# Patient Record
Sex: Female | Born: 2000 | Race: White | Hispanic: Yes | Marital: Single | Smoking: Never smoker
Health system: Southern US, Community
[De-identification: ages and names within clinical notes are randomized; demographics above are authoritative.]

## PROBLEM LIST (undated history)

## (undated) ENCOUNTER — Inpatient Hospital Stay (HOSPITAL_COMMUNITY): Payer: Self-pay

---
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
## 2022-01-12 ENCOUNTER — Emergency Department (HOSPITAL_COMMUNITY)
Admission: EM | Admit: 2022-01-12 | Discharge: 2022-01-12 | Discharge status: Against Medical Advice | Payer: Medicaid Other | Attending: Emergency Medicine | Admitting: Emergency Medicine

## 2022-01-12 ENCOUNTER — Encounter (HOSPITAL_BASED_OUTPATIENT_CLINIC_OR_DEPARTMENT_OTHER): Payer: Self-pay | Admitting: Emergency Medicine

## 2022-01-12 ENCOUNTER — Emergency Department (HOSPITAL_BASED_OUTPATIENT_CLINIC_OR_DEPARTMENT_OTHER): Payer: Medicaid Other

## 2022-01-12 ENCOUNTER — Emergency Department (HOSPITAL_BASED_OUTPATIENT_CLINIC_OR_DEPARTMENT_OTHER)
Admission: EM | Admit: 2022-01-12 | Discharge: 2022-01-12 | Disposition: A | Discharge status: Home / Self Care | Payer: Medicaid Other | Source: Home / Self Care | Attending: Emergency Medicine | Admitting: Emergency Medicine

## 2022-01-12 ENCOUNTER — Encounter (HOSPITAL_COMMUNITY): Payer: Self-pay

## 2022-01-12 ENCOUNTER — Other Ambulatory Visit: Payer: Self-pay

## 2022-01-12 DIAGNOSIS — R29810 Facial weakness: Secondary | ICD-10-CM | POA: Insufficient documentation

## 2022-01-12 DIAGNOSIS — R202 Paresthesia of skin: Secondary | ICD-10-CM | POA: Insufficient documentation

## 2022-01-12 DIAGNOSIS — R519 Headache, unspecified: Secondary | ICD-10-CM | POA: Insufficient documentation

## 2022-01-12 DIAGNOSIS — Z5321 Procedure and treatment not carried out due to patient leaving prior to being seen by health care provider: Secondary | ICD-10-CM | POA: Insufficient documentation

## 2022-01-12 DIAGNOSIS — H538 Other visual disturbances: Secondary | ICD-10-CM | POA: Insufficient documentation

## 2022-01-12 DIAGNOSIS — R200 Anesthesia of skin: Secondary | ICD-10-CM | POA: Insufficient documentation

## 2022-01-12 LAB — CBC WITH DIFFERENTIAL/PLATELET
Abs Immature Granulocytes: 0.02 10*3/uL (ref 0.00–0.07)
Basophils Absolute: 0 10*3/uL (ref 0.0–0.1)
Basophils Relative: 1 %
Eosinophils Absolute: 0.1 10*3/uL (ref 0.0–0.5)
Eosinophils Relative: 1 %
HCT: 39.1 % (ref 36.0–46.0)
Hemoglobin: 13.4 g/dL (ref 12.0–15.0)
Immature Granulocytes: 0 %
Lymphocytes Relative: 37 %
Lymphs Abs: 2.5 10*3/uL (ref 0.7–4.0)
MCH: 31.5 pg (ref 26.0–34.0)
MCHC: 34.3 g/dL (ref 30.0–36.0)
MCV: 91.8 fL (ref 80.0–100.0)
Monocytes Absolute: 0.5 10*3/uL (ref 0.1–1.0)
Monocytes Relative: 8 %
Neutro Abs: 3.7 10*3/uL (ref 1.7–7.7)
Neutrophils Relative %: 53 %
Platelets: 280 10*3/uL (ref 150–400)
RBC: 4.26 MIL/uL (ref 3.87–5.11)
RDW: 12.8 % (ref 11.5–15.5)
WBC: 6.8 10*3/uL (ref 4.0–10.5)
nRBC: 0 % (ref 0.0–0.2)

## 2022-01-12 LAB — BASIC METABOLIC PANEL
Anion gap: 8 (ref 5–15)
BUN: 8 mg/dL (ref 6–20)
CO2: 24 mmol/L (ref 22–32)
Calcium: 9.8 mg/dL (ref 8.9–10.3)
Chloride: 105 mmol/L (ref 98–111)
Creatinine, Ser: 0.58 mg/dL (ref 0.44–1.00)
GFR, Estimated: >60 mL/min (ref >60)
Glucose, Bld: 94 mg/dL (ref 70–99)
Potassium: 4.6 mmol/L (ref 3.5–5.1)
Sodium: 137 mmol/L (ref 135–145)

## 2022-01-12 MED ORDER — PREDNISONE 50 MG PO TABS
60.0000 mg | ORAL_TABLET | Freq: Once | ORAL | Status: AC
  Administered 2022-01-12: 60 mg via ORAL
  Filled 2022-01-12: qty 1

## 2022-01-12 MED ORDER — PREDNISONE 10 MG PO TABS: 50.0000 mg | ORAL_TABLET | Freq: Every day | ORAL | 0 refills | Status: AC

## 2022-01-12 NOTE — ED Triage Notes (Signed)
C/o left sided facial numbness and left sided facial (mouth) droop since last Wednesday. Also endorses changes in BL vision (blurry) and right temporal tenderness since Friday. Denies headaches, n/v, dizziness, head trauma.

## 2022-01-12 NOTE — ED Provider Triage Note (Signed)
Emergency Medicine Provider Triage Evaluation Note  Carrie Downs , a 21 y.o. female  was evaluated in triage.  Pt complains of left-sided jaw numbness since 01/07/2022.  She noticed this in the evening when she was brushing her teeth.  This has persisted since then.  She denies headache or vision changes.  Reports some soreness to the right side of her face but she is unsure if this is related.  Denies any injuries or trauma, recent dental pain or procedure.  Review of Systems  Positive: Left-sided jaw numbness, right-sided facial pain Negative: Headache, dental pain  Physical Exam  BP 115/79 (BP Location: Right Arm)   Pulse 72   Temp 98.2 F (36.8 C) (Oral)   Resp 17   Ht 5\' 3"  (1.6 m)   Wt 67.6 kg   SpO2 100%   BMI 26.39 kg/m  Gen:   Awake, no distress   Resp:  Normal effort  MSK:   Moves extremities without difficulty  Other:  Patient reports decreased sensation to light touch of left jaw area.  No facial asymmetry.  Normal speech.  Strength 5/5 in bilateral upper and lower extremities  Medical Decision Making  Medically screening exam initiated at 2:39 PM.  Appropriate orders placed.  Kendell Gammon was informed that the remainder of the evaluation will be completed by another provider, this initial triage assessment does not replace that evaluation, and the importance of remaining in the ED until their evaluation is complete.  No indication for code stroke   Brantley Fling, PA-C 01/12/22 1440

## 2022-01-12 NOTE — ED Notes (Signed)
Pt states that she is leaving due to the wait time and is going to another hospital

## 2022-01-12 NOTE — Discharge Instructions (Addendum)
You were diagnosed with a possible Bell's palsy today, which is a paralysis of the facial nerve.  I included information for you to read about.  Your next dose of steroids is due tomorrow morning.  I also placed a referral to neurology.  They should contact you within 3 business days to set up a follow-up appointment.  In the meantime, you can schedule a follow-up appointment with your pediatrician towards the end of the week as you complete your steroids.  We talked about return precautions to the emergency room.  This would include sudden, severe worsening headache, loss of vision, painful vision (pain in eye, sensitivity to light), or numbness or weakness of the arms or legs.

## 2022-01-12 NOTE — ED Provider Notes (Signed)
[facility] EMERGENCY DEPARTMENT Provider Note   CSN: [id number] Arrival date & time: 01/12/22  1649     History {Add pertinent medical, surgical, social history, OB history to HPI:1} Chief Complaint  Patient presents with   Facial Droop    Carrie Downs is a 21 y.o. female presenting to ED with complaint of some paresthesias of the left side of the face, ongoing for approximately 5 days.  She has never had the symptoms before.  She reports it began with a tingling sensation down the left half of her face, she thought a left lower sided facial droop, and she began having some blurred vision, not clear which eye was more blurry, but that has been intermittent.  She reports that she has a mild frontal headache as well, also located along the right temple region.  She denies nausea, vomiting.  She denies any recent tick bites or new rashes.  She denies any new medications.  She denies history of Bell's palsy.  Her mother is present at bedside to provide supplemental history, denies any family history of MS or autoimmune disease.  Patient denies any neck pain, fevers, tingling of the hands and feet.  She went to [facility] emergency department originally, where she was triaged with a left due to a long wait.  She did have a screening EKG performed at that time.  She has not had any neuroimaging done.  HPI     Home Medications Prior to Admission medications   Medication Sig Start Date End Date Taking? Authorizing Provider  ibuprofen (ADVIL,MOTRIN) 600 MG tablet Take 1 tablet (600 mg total) by mouth every 6 (six) hours as needed. 09/23/17   Jacalyn Lefevre, MD      Allergies    Patient has no known allergies.    Review of Systems   Review of Systems  Physical Exam Updated Vital Signs BP 108/69 (BP Location: Right Arm)   Pulse 76   Temp 98.9 F (37.2 C) (Oral)   Resp 16   Ht 5\' 3"  (1.6 m)   Wt 67.6 kg   SpO2 99%   BMI 26.39 kg/m  Physical Exam Constitutional:       General: She is not in acute distress. HENT:     Head: Normocephalic and atraumatic.     Right Ear: Tympanic membrane and ear canal normal.     Left Ear: Tympanic membrane and ear canal normal.  Eyes:     Conjunctiva/sclera: Conjunctivae normal.     Pupils: Pupils are equal, round, and reactive to light.  Cardiovascular:     Rate and Rhythm: Normal rate and regular rhythm.  Pulmonary:     Effort: Pulmonary effort is normal. No respiratory distress.  Abdominal:     General: There is no distension.     Tenderness: There is no abdominal tenderness.  Skin:    General: Skin is warm and dry.  Neurological:     General: No focal deficit present.     Mental Status: She is alert. Mental status is at baseline.     Sensory: No sensory deficit.     Motor: No weakness.     Comments: Paresthesias of the left half of the face Flattening of the forehead muscle No temporal artery tenderness  Psychiatric:        Mood and Affect: Mood normal.        Behavior: Behavior normal.     ED Results / Procedures / Treatments   Labs (all labs  ordered are listed, but only abnormal results are displayed) Labs Reviewed - No data to display  EKG None  Radiology No results found.  Procedures Procedures  {Document cardiac monitor, telemetry assessment procedure when appropriate:1}  Medications Ordered in ED Medications - No data to display  ED Course/ Medical Decision Making/ A&P                           Medical Decision Making Amount and/or Complexity of Data Reviewed Radiology: ordered.   Patient is presenting with an unusual constellation of symptoms, that could be consistent with a Bell's palsy, specifically left-sided paresthesias.  She complains of some right-sided temporal pain as well, and the differential diagnosis could include complex migraine versus sinusitis versus other intracranial lesion.  Because she has never had neuroimaging I think a CT scan of the head is reasonable.  CT  was ordered and personally reviewed, showing ***  Her headache is extremely minimal, not requiring IV medications.  Low suspicion for cerebral venous thrombosis.  Her vision appears grossly intact, including the peripheral fields, there is no double or blurred vision at this time.  I have a lower suspicion for new onset multiple sclerosis as well.  However if she continues having worsening symptoms including blurred or loss of vision, or numbness or weakness of the arms or legs, I recommended she return to the ER for more emergent MRI imaging at that time.  In the meantime I will refer to neurology.  We will start the patient on prednisone for suspected Bell's palsy and referred to neurology  {Document critical care time when appropriate:1} {Document review of labs and clinical decision tools ie heart score, Chads2Vasc2 etc:1}  {Document your independent review of radiology images, and any outside records:1} {Document your discussion with family members, caretakers, and with consultants:1} {Document social determinants of health affecting pt's care:1} {Document your decision making why or why not admission, treatments were needed:1} Final Clinical Impression(s) / ED Diagnoses Final diagnoses:  None    Rx / DC Orders ED Discharge Orders     None

## 2022-01-12 NOTE — ED Triage Notes (Signed)
Reports left sided facial numbness and right side facial soreness.  REports feels as though her taste buds have been burnt off.

## 2022-01-22 ENCOUNTER — Encounter: Payer: Self-pay | Admitting: Neurology

## 2022-01-22 ENCOUNTER — Ambulatory Visit: Payer: Medicaid Other | Admitting: Neurology

## 2022-01-22 VITALS — BP 102/70 | HR 83 | Ht 63.6 in | Wt 174.5 lb

## 2022-01-22 DIAGNOSIS — G510 Bell's palsy: Secondary | ICD-10-CM

## 2022-01-22 NOTE — Progress Notes (Signed)
[facility]  PATIENT: Carrie Downs DOB: 12-May-2001  REQUESTING CLINICIAN: Terald Sleeper, MD HISTORY FROM: Patient and mother   REASON FOR VISIT: Right facial weakness and numbness   HISTORICAL  CHIEF COMPLAINT:  Chief Complaint  Patient presents with   New Patient (Initial Visit)    Rm 15. Accompanied by mom and sister. NP internal referral for Facial paresthesia.    HISTORY OF PRESENT ILLNESS:  This is a 21 year old womanoman with no reported past medical history who is presenting for follow up after being seen in the ED on 6/19 for right facial weakness. Patient reports her initial symptoms were right facial soreness and feel like her taste buds have been burned off and feel like her vision was blurred. She presented 5 days after the onset of symptoms. In the ED, she had a head CT which was negative, exam consistent with right facial paralysis and she was discharged with steroid. She denies any weakness or numbness in the extremities.  She completed the steroid treatment and reports improvement of her symptoms. Her main symptoms currently is right watery eye.    OTHER MEDICAL CONDITIONS: None    REVIEW OF SYSTEMS: Full 14 system review of systems performed and negative with exception of: as noted in the HPI   ALLERGIES: No Known Allergies  HOME MEDICATIONS: Outpatient Medications Prior to Visit  Medication Sig Dispense Refill   ibuprofen (ADVIL,MOTRIN) 600 MG tablet Take 1 tablet (600 mg total) by mouth every 6 (six) hours as needed. 30 tablet 0   No facility-administered medications prior to visit.    PAST MEDICAL HISTORY: History reviewed. No pertinent past medical history.  PAST SURGICAL HISTORY: History reviewed. No pertinent surgical history.  FAMILY HISTORY: Family History  Problem Relation Age of Onset   Diabetes Brother     SOCIAL HISTORY: Social History   Socioeconomic History   Marital status: Single    Spouse name: Not on  file   Number of children: Not on file   Years of education: Not on file   Highest education level: Not on file  Occupational History   Not on file  Tobacco Use   Smoking status: Never   Smokeless tobacco: Never  Vaping Use   Vaping Use: Never used  Substance and Sexual Activity   Alcohol use: Never   Drug use: Never   Sexual activity: Not on file  Other Topics Concern   Not on file  Social History Narrative   Not on file   Social Determinants of Health   Financial Resource Strain: Not on file  Food Insecurity: Not on file  Transportation Needs: Not on file  Physical Activity: Not on file  Stress: Not on file  Social Connections: Not on file  Intimate Partner Violence: Not on file    PHYSICAL EXAM   GENERAL EXAM/CONSTITUTIONAL: Vitals:  Vitals:   01/22/22 1019  BP: 102/70  Pulse: 83  Weight: 174 lb 8 oz (79.2 kg)  Height: 5' 3.6" (1.615 m)   Body mass index is 30.33 kg/m. Wt Readings from Last 3 Encounters:  01/22/22 174 lb 8 oz (79.2 kg) (94 %, Z= 1.54)*  01/12/22 149 lb (67.6 kg) (82 %, Z= 0.91)*  01/12/22 149 lb (67.6 kg) (82 %, Z= 0.91)*   * Growth percentiles are based on CDC (Girls, 2-20 Years) data.   Patient is in no distress; well developed, nourished and groomed; neck is supple  EYES: Pupils round and reactive to light,  Visual fields full to confrontation, Extraocular movements intacts,   MUSCULOSKELETAL: Gait, strength, tone, movements noted in Neurologic exam below  NEUROLOGIC: MENTAL STATUS:      No data to display         awake, alert, oriented to person, place and time recent and remote memory intact normal attention and concentration language fluent, comprehension intact, naming intact fund of knowledge appropriate  CRANIAL NERVE:  2nd, 3rd, 4th, 6th - pupils equal and reactive to light, visual fields full to confrontation, extraocular muscles intact, no nystagmus 5th - facial sensation symmetric 7th - facial strength  symmetric 8th - hearing intact 9th - palate elevates symmetrically, uvula midline 11th - shoulder shrug symmetric 12th - tongue protrusion midline  MOTOR:  normal bulk and tone, full strength in the BUE, BLE  SENSORY:  normal and symmetric to light touch  COORDINATION:  finger-nose-finger, fine finger movements normal  REFLEXES:  deep tendon reflexes present and symmetric  GAIT/STATION:  normal   DIAGNOSTIC DATA (LABS, IMAGING, TESTING) - I reviewed patient records, labs, notes, testing and imaging myself where available.  Lab Results  Component Value Date   WBC 6.8 01/12/2022   HGB 13.4 01/12/2022   HCT 39.1 01/12/2022   MCV 91.8 01/12/2022   PLT 280 01/12/2022      Component Value Date/Time   NA 137 01/12/2022 1444   K 4.6 01/12/2022 1444   CL 105 01/12/2022 1444   CO2 24 01/12/2022 1444   GLUCOSE 94 01/12/2022 1444   BUN 8 01/12/2022 1444   CREATININE 0.58 01/12/2022 1444   CALCIUM 9.8 01/12/2022 1444   PROT 7.3 09/22/2017 2345   ALBUMIN 4.2 09/22/2017 2345   AST 18 09/22/2017 2345   ALT 15 09/22/2017 2345   ALKPHOS 153 09/22/2017 2345   BILITOT 0.4 09/22/2017 2345   GFRNONAA >60 01/12/2022 1444   GFRAA NOT CALCULATED 09/22/2017 2345   No results found for: "CHOL", "HDL", "LDLCALC", "LDLDIRECT", "TRIG", "CHOLHDL" No results found for: "HGBA1C" No results found for: "VITAMINB12" No results found for: "TSH"  Head CT 01/12/22 1. No acute intracranial abnormalities.   ASSESSMENT AND PLAN  21 y.o. year old femalee with no past medical history who presented to the ED after 5 days history of right facial weakness, diagnosed with Bells Palsy and started on steroid. She completed the steroids course, reports improvement of her symptoms and doing better. At this time, no additional testing needed, I have informed her that most likely her symptoms will improved and hopefully back to normal. Continue to follow up with your PCP and return as needed or any other  concerns.    1. Bell's palsy      Patient Instructions  Return as needed   No orders of the defined types were placed in this encounter.   No orders of the defined types were placed in this encounter.   Return if symptoms worsen or fail to improve.    Windell Norfolk, MD 01/22/2022, 5:46 PM  [facility] [address] [place], [place] [address] [phone]

## 2022-01-22 NOTE — Patient Instructions (Signed)
Return as needed

## 2024-02-09 ENCOUNTER — Inpatient Hospital Stay (HOSPITAL_COMMUNITY)
Admission: AD | Admit: 2024-02-09 | Discharge: 2024-02-10 | Disposition: A | Discharge status: Home / Self Care | Attending: Obstetrics & Gynecology | Admitting: Obstetrics & Gynecology

## 2024-02-09 DIAGNOSIS — Z3A09 9 weeks gestation of pregnancy: Secondary | ICD-10-CM | POA: Insufficient documentation

## 2024-02-09 DIAGNOSIS — O209 Hemorrhage in early pregnancy, unspecified: Secondary | ICD-10-CM

## 2024-02-09 DIAGNOSIS — O26899 Other specified pregnancy related conditions, unspecified trimester: Secondary | ICD-10-CM

## 2024-02-09 DIAGNOSIS — Z3A01 Less than 8 weeks gestation of pregnancy: Secondary | ICD-10-CM

## 2024-02-09 DIAGNOSIS — O3680X0 Pregnancy with inconclusive fetal viability, not applicable or unspecified: Secondary | ICD-10-CM | POA: Insufficient documentation

## 2024-02-09 DIAGNOSIS — O26851 Spotting complicating pregnancy, first trimester: Secondary | ICD-10-CM | POA: Insufficient documentation

## 2024-02-09 DIAGNOSIS — R102 Pelvic and perineal pain: Secondary | ICD-10-CM | POA: Insufficient documentation

## 2024-02-09 DIAGNOSIS — O26891 Other specified pregnancy related conditions, first trimester: Secondary | ICD-10-CM | POA: Insufficient documentation

## 2024-02-10 ENCOUNTER — Inpatient Hospital Stay (HOSPITAL_COMMUNITY)

## 2024-02-10 ENCOUNTER — Encounter (HOSPITAL_COMMUNITY): Payer: Self-pay

## 2024-02-10 DIAGNOSIS — O209 Hemorrhage in early pregnancy, unspecified: Secondary | ICD-10-CM

## 2024-02-10 DIAGNOSIS — O26891 Other specified pregnancy related conditions, first trimester: Secondary | ICD-10-CM

## 2024-02-10 DIAGNOSIS — O3680X0 Pregnancy with inconclusive fetal viability, not applicable or unspecified: Secondary | ICD-10-CM | POA: Diagnosis not present

## 2024-02-10 DIAGNOSIS — Z3A01 Less than 8 weeks gestation of pregnancy: Secondary | ICD-10-CM | POA: Diagnosis not present

## 2024-02-10 DIAGNOSIS — R109 Unspecified abdominal pain: Secondary | ICD-10-CM

## 2024-02-10 DIAGNOSIS — Z3A09 9 weeks gestation of pregnancy: Secondary | ICD-10-CM | POA: Diagnosis not present

## 2024-02-10 DIAGNOSIS — O26851 Spotting complicating pregnancy, first trimester: Secondary | ICD-10-CM | POA: Diagnosis not present

## 2024-02-10 DIAGNOSIS — R102 Pelvic and perineal pain: Secondary | ICD-10-CM | POA: Diagnosis not present

## 2024-02-10 LAB — URINALYSIS, ROUTINE W REFLEX MICROSCOPIC
Bilirubin Urine: NEGATIVE
Glucose, UA: NEGATIVE mg/dL
Ketones, ur: NEGATIVE mg/dL
Leukocytes,Ua: NEGATIVE
Nitrite: NEGATIVE
Protein, ur: NEGATIVE mg/dL
Specific Gravity, Urine: 1.03 (ref 1.005–1.030)
pH: 5 (ref 5.0–8.0)

## 2024-02-10 LAB — CBC
HCT: 40.4 % (ref 36.0–46.0)
Hemoglobin: 14 g/dL (ref 12.0–15.0)
MCH: 31.8 pg (ref 26.0–34.0)
MCHC: 34.7 g/dL (ref 30.0–36.0)
MCV: 91.8 fL (ref 80.0–100.0)
Platelets: 298 K/uL (ref 150–400)
RBC: 4.4 MIL/uL (ref 3.87–5.11)
RDW: 12.9 % (ref 11.5–15.5)
WBC: 13.1 K/uL — ABNORMAL HIGH (ref 4.0–10.5)
nRBC: 0 % (ref 0.0–0.2)

## 2024-02-10 LAB — WET PREP, GENITAL
Clue Cells Wet Prep HPF POC: NONE SEEN
Sperm: NONE SEEN
Trich, Wet Prep: NONE SEEN
WBC, Wet Prep HPF POC: <10 (ref <10)
Yeast Wet Prep HPF POC: NONE SEEN

## 2024-02-10 LAB — HIV ANTIBODY (ROUTINE TESTING W REFLEX): HIV Screen 4th Generation wRfx: NONREACTIVE

## 2024-02-10 LAB — GC/CHLAMYDIA PROBE AMP (~~LOC~~) NOT AT ARMC
Chlamydia: NEGATIVE
Comment: NEGATIVE
Comment: NORMAL
Neisseria Gonorrhea: NEGATIVE

## 2024-02-10 LAB — ABO/RH: ABO/RH(D): O POS

## 2024-02-10 LAB — HCG, QUANTITATIVE, PREGNANCY: hCG, Beta Chain, Quant, S: 82294 m[IU]/mL — ABNORMAL HIGH (ref <5)

## 2024-02-10 LAB — POCT PREGNANCY, URINE: Preg Test, Ur: POSITIVE — AB

## 2024-02-10 NOTE — MAU Provider Note (Signed)
 Chief Complaint: Vaginal Bleeding and Abdominal Pain   Seen by provider at 0030      SUBJECTIVE HPI: Carrie Downs is a 23 y.o. G1P0 at [redacted]w[redacted]d by LMP who presents to maternity admissions reporting spotting and lower abdominal cramping.  . She denies  vaginal itching/burning, urinary symptoms, h/a, dizziness, n/v, or fever/chills.     Vaginal Bleeding The patient's pertinent negatives include no genital itching or genital odor. This is a new problem. The problem has been unchanged. The pain is mild. She is pregnant. Associated symptoms include abdominal pain. Pertinent negatives include no constipation or diarrhea. Nothing aggravates the symptoms. She has tried nothing for the symptoms.  Abdominal Pain Pertinent negatives include no constipation or diarrhea.   RN Note:  Carrie Downs is a 23 y.o. at Unknown here in MAU reporting: been spotting today - this morning was just when she wiped and now brown discharge in underwear. Reports lower abdominal cramping. Last intercourse was 2 days ago. Positive home pregnancy test last month.    LMP: 5/15 Pain score: 6      No past medical history on file. No past surgical history on file. Social History   Socioeconomic History   Marital status: Single    Spouse name: Not on file   Number of children: Not on file   Years of education: Not on file   Highest education level: Not on file  Occupational History   Not on file  Tobacco Use   Smoking status: Never   Smokeless tobacco: Never  Vaping Use   Vaping status: Never Used  Substance and Sexual Activity   Alcohol use: Never   Drug use: Never   Sexual activity: Not on file  Other Topics Concern   Not on file  Social History Narrative   Not on file   Social Drivers of Health   Financial Resource Strain: Not on file  Food Insecurity: Not on file  Transportation Needs: Not on file  Physical Activity: Not on file  Stress: Not on file  Social Connections: Not on file  Intimate  Partner Violence: Not on file   No current facility-administered medications on file prior to encounter.   Current Outpatient Medications on File Prior to Encounter  Medication Sig Dispense Refill   ibuprofen  (ADVIL ,MOTRIN ) 600 MG tablet Take 1 tablet (600 mg total) by mouth every 6 (six) hours as needed. 30 tablet 0   No Known Allergies  I have reviewed patient's Past Medical Hx, Surgical Hx, Family Hx, Social Hx, medications and allergies.   ROS:  Review of Systems  Gastrointestinal:  Positive for abdominal pain. Negative for constipation and diarrhea.  Genitourinary:  Positive for vaginal bleeding.   Review of Systems  Other systems negative   Physical Exam  Physical Exam Patient Vitals for the past 24 hrs:  BP Temp Temp src Pulse Resp SpO2 Height Weight  02/10/24 0004 110/66 98.7 F (37.1 C) Oral 76 18 100 % 5' 5 (1.651 m) 79.7 kg   Constitutional: Well-developed, well-nourished female in no acute distress.  Cardiovascular: normal rate Respiratory: normal effort GI: Abd soft, non-tender.  MS: Extremities nontender, no edema, normal ROM Neurologic: Alert and oriented x 4.   PELVIC EXAM: deferred in lieu of ultrasound LAB RESULTS Results for orders placed or performed during the hospital encounter of 02/09/24 (from the past 72 hours)  Pregnancy, urine POC     Status: Abnormal   Collection Time: 02/10/24 12:13 AM  Result Value Ref Range  Preg Test, Ur POSITIVE (A) NEGATIVE    Comment:        THE SENSITIVITY OF THIS METHODOLOGY IS >20 mIU/mL.   Urinalysis, Routine w reflex microscopic -Urine, Clean Catch     Status: Abnormal   Collection Time: 02/10/24 12:13 AM  Result Value Ref Range   Color, Urine YELLOW YELLOW   APPearance HAZY (A) CLEAR   Specific Gravity, Urine 1.030 1.005 - 1.030   pH 5.0 5.0 - 8.0   Glucose, UA NEGATIVE NEGATIVE mg/dL   Hgb urine dipstick MODERATE (A) NEGATIVE   Bilirubin Urine NEGATIVE NEGATIVE   Ketones, ur NEGATIVE NEGATIVE mg/dL    Protein, ur NEGATIVE NEGATIVE mg/dL   Nitrite NEGATIVE NEGATIVE   Leukocytes,Ua NEGATIVE NEGATIVE   RBC / HPF 0-5 0 - 5 RBC/hpf   WBC, UA 0-5 0 - 5 WBC/hpf   Bacteria, UA MANY (A) NONE SEEN   Squamous Epithelial / HPF 6-10 0 - 5 /HPF   Mucus PRESENT     Comment: Performed at [facility], [address] [place], [place] [address]  GC/Chlamydia probe amp ([place])not at [facility]     Status: None   Collection Time: 02/10/24  1:44 AM  Result Value Ref Range   Neisseria Gonorrhea Negative    Chlamydia Negative    Comment Normal Reference Ranger Chlamydia - Negative    Comment      Normal Reference Range Neisseria Gonorrhea - Negative  Wet prep, genital     Status: None   Collection Time: 02/10/24  1:57 AM  Result Value Ref Range   Yeast Wet Prep HPF POC NONE SEEN NONE SEEN   Trich, Wet Prep NONE SEEN NONE SEEN   Clue Cells Wet Prep HPF POC NONE SEEN NONE SEEN   WBC, Wet Prep HPF POC <10 <10   Sperm NONE SEEN     Comment: Performed at [facility], [address], [place], [place] [address]  ABO/Rh     Status: None   Collection Time: 02/10/24  2:09 AM  Result Value Ref Range   ABO/RH(D) O POS    No rh immune globuloin      NOT A RH IMMUNE GLOBULIN CANDIDATE, PT RH POSITIVE Performed at [facility], [address] [place], [place] [address]   CBC     Status: Abnormal   Collection Time: 02/10/24  2:13 AM  Result Value Ref Range   WBC 13.1 (H) 4.0 - 10.5 K/uL   RBC 4.40 3.87 - 5.11 MIL/uL   Hemoglobin 14.0 12.0 - 15.0 g/dL   HCT 59.5 63.9 - 53.9 %   MCV 91.8 80.0 - 100.0 fL   MCH 31.8 26.0 - 34.0 pg   MCHC 34.7 30.0 - 36.0 g/dL   RDW 87.0 88.4 - 84.4 %   Platelets 298 150 - 400 K/uL   nRBC 0.0 0.0 - 0.2 %    Comment: Performed at [facility], [address], [place], [place] [address]  hCG, quantitative, pregnancy     Status: Abnormal   Collection Time: 02/10/24  2:13 AM  Result Value Ref Range   hCG, Beta Chain, Quant, S 82,294 (H) <5 mIU/mL     Comment:          GEST. AGE      CONC.  (mIU/mL)   <=1 WEEK        5 - 50     2 WEEKS  50 - 500     3 WEEKS       100 - 10,000     4 WEEKS     1,000 - 30,000     5 WEEKS     3,500 - 115,000   6-8 WEEKS     12,000 - 270,000    12 WEEKS     15,000 - 220,000        FEMALE AND NON-PREGNANT FEMALE:     LESS THAN 5 mIU/mL Performed at [facility], [address] [place], [place] [address]   HIV Antibody (routine testing w rflx)     Status: None   Collection Time: 02/10/24  2:13 AM  Result Value Ref Range   HIV Screen 4th Generation wRfx Non Reactive Non Reactive    Comment: Performed at [facility], [address], [place], [place] [address]       IMAGING .US  OB Comp Less 14 Wks Result Date: 02/10/2024 CLINICAL DATA:  Bleeding in early pregnancy EXAM: OBSTETRIC <14 WK ULTRASOUND TECHNIQUE: Transabdominal ultrasound was performed for evaluation of the gestation as well as the maternal uterus and adnexal regions. COMPARISON:  None Available. FINDINGS: Intrauterine gestational sac: Present Yolk sac:  Present Embryo:  Present Cardiac Activity: Present Heart Rate: 150 bpm CRL:   11.4 mm   7 w 2 d                  US  EDC: 09/26/2024 Subchorionic hemorrhage:  None visualized. Maternal uterus/adnexae: Adnexa are within normal limits. IMPRESSION: Single live intrauterine gestation at 7 weeks 2 days. No acute abnormality noted. Electronically Signed   By: Oneil Devonshire M.D.   On: 02/10/2024 03:11      MAU Management/MDM: I have reviewed the triage vital signs and the nursing notes.   Pertinent labs & imaging results that were available during my care of the patient were reviewed by me and considered in my medical decision making (see chart for details).      I have reviewed her medical records including past results, notes and treatments. Medical, Surgical, and family history were reviewed.  Medications and recent lab tests were reviewed  Ordered usual first trimester r/o ectopic  labs.   Pelvic  cultures done Will check baseline Ultrasound to rule out ectopic.   This bleeding/pain can represent a normal pregnancy with bleeding, spontaneous abortion or even an ectopic which can be life-threatening.  The process as listed above helps to determine which of these is present.  Reviewed results and US  findings.   ASSESSMENT Single iup at [redacted]w[redacted]d Pregnancy of unknown location Pelvic pain  Spottingin early pregnancy  PLAN Discharge home Encouraged to seek prenatal care, list given   Pt stable at time of discharge. Encouraged to return here if she develops worsening of symptoms, increase in pain, fever, or other concerning symptoms.    Earnie Pouch CNM, MSN Certified Nurse-Midwife 02/10/2024  1:44 AM

## 2024-02-10 NOTE — MAU Note (Signed)
 [name] is a 23 y.o. at Unknown here in MAU reporting: been spotting today - this morning was just when she wiped and now brown discharge in underwear. Reports lower abdominal cramping. Last intercourse was 2 days ago. Positive home pregnancy test last month.   LMP: 5/15 Pain score: 6 Vitals:   02/10/24 0004  BP: 110/66  Pulse: 76  Resp: 18  Temp: 98.7 F (37.1 C)  SpO2: 100%     FHT: NA  Lab orders placed from triage: urine pregnancy; UA

## 2024-02-10 NOTE — Discharge Instructions (Signed)
